# Patient Record
Sex: Female | Born: 1950 | Race: White | Hispanic: No | Marital: Married | Smoking: Never smoker
Health system: Southern US, Community
[De-identification: ages and names within clinical notes are randomized; demographics above are authoritative.]

## PROBLEM LIST (undated history)

## (undated) DIAGNOSIS — I10 Essential (primary) hypertension: Secondary | ICD-10-CM

## (undated) DIAGNOSIS — E785 Hyperlipidemia, unspecified: Secondary | ICD-10-CM

## (undated) HISTORY — PX: TUBAL LIGATION: SHX77

---

## 2009-03-20 ENCOUNTER — Encounter: Admission: RE | Admit: 2009-03-20 | Discharge: 2009-03-20 | Payer: Self-pay | Admitting: Family Medicine

## 2009-03-20 ENCOUNTER — Emergency Department (HOSPITAL_BASED_OUTPATIENT_CLINIC_OR_DEPARTMENT_OTHER): Admission: EM | Admit: 2009-03-20 | Discharge: 2009-03-20 | Payer: Self-pay | Admitting: Emergency Medicine

## 2009-03-20 ENCOUNTER — Ambulatory Visit: Payer: Self-pay | Admitting: Diagnostic Radiology

## 2009-03-29 ENCOUNTER — Emergency Department (HOSPITAL_BASED_OUTPATIENT_CLINIC_OR_DEPARTMENT_OTHER): Admission: EM | Admit: 2009-03-29 | Discharge: 2009-03-29 | Payer: Self-pay | Admitting: Emergency Medicine

## 2009-03-31 ENCOUNTER — Encounter: Admission: RE | Admit: 2009-03-31 | Discharge: 2009-03-31 | Payer: Self-pay | Admitting: Family Medicine

## 2010-07-17 ENCOUNTER — Ambulatory Visit: Payer: Self-pay | Admitting: Family Medicine

## 2010-07-17 DIAGNOSIS — M722 Plantar fascial fibromatosis: Secondary | ICD-10-CM | POA: Insufficient documentation

## 2010-07-20 ENCOUNTER — Ambulatory Visit: Payer: Self-pay | Admitting: Family Medicine

## 2010-07-29 ENCOUNTER — Telehealth (INDEPENDENT_AMBULATORY_CARE_PROVIDER_SITE_OTHER): Payer: Self-pay | Admitting: *Deleted

## 2010-08-11 IMAGING — CT CT ANGIO CHEST
2 of 6 series · 19 of 36 positions shown · IV contrast (APPLIED)
Comparison: Chest radiography same day

CLINICAL DATA: Short of breath.  Cough.  Fever.  Distant history
of contrast reaction.

CT ANGIOGRAPHY CHEST WITH CONTRAST
TECHNIQUE: Multidetector CT imaging of the chest was performed
using the standard protocol during bolus administration of
intravenous contrast. Multiplanar CT image reconstructions
including MIPs were obtained to evaluate the vascular anatomy.
Contrast: 80 ml Omnipaque 350.  The patient was treated with 125 mg
Solu-Medrol intravenously and 50 mg of Benadryl intramuscularly
hours before the scan.

[Series 6: pe 2.0 coronal · coronal · 0.50mm/px · 1 of 117 slices shown]
[im 59/117  mediastinal]
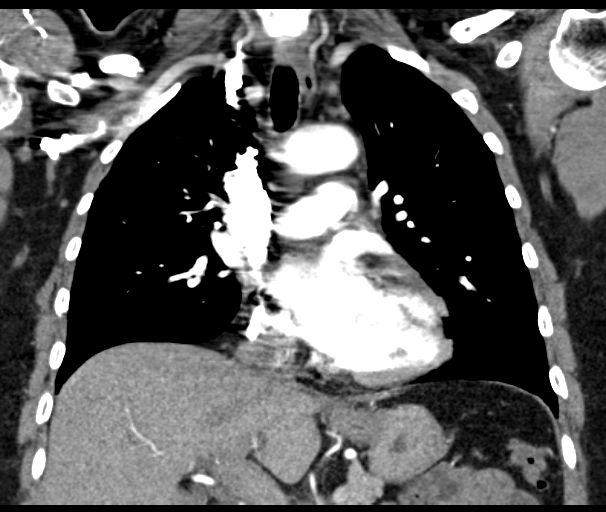

[Series 10: pe 1.0 b25f · axial · 0.64mm/px · z∈[-69,+153]mm · 18 of 248 slices shown]
[im 13/248  lung]
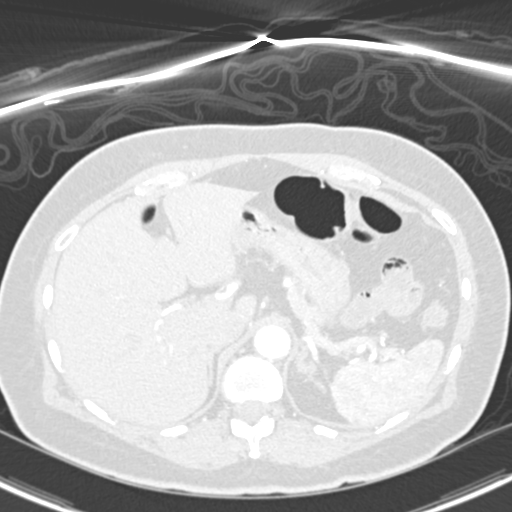
[im 25/248  mediastinal]
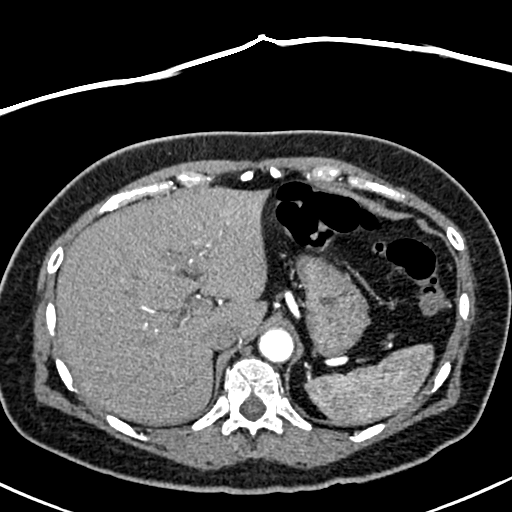
[im 38/248  lung]
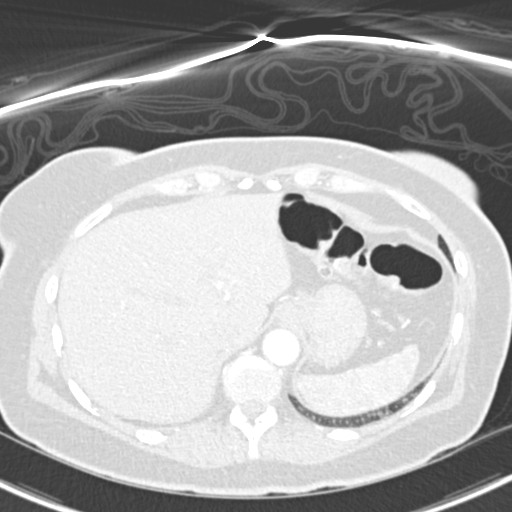
[im 50/248  mediastinal]
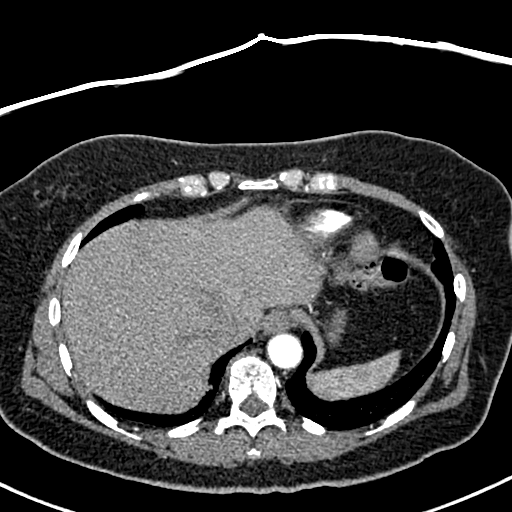
[im 62/248  lung]
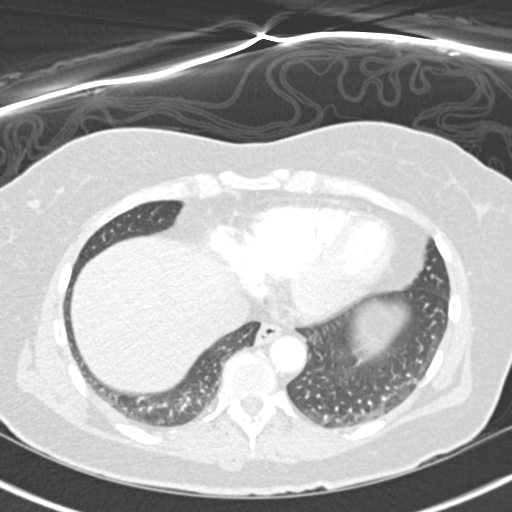
[im 75/248  mediastinal]
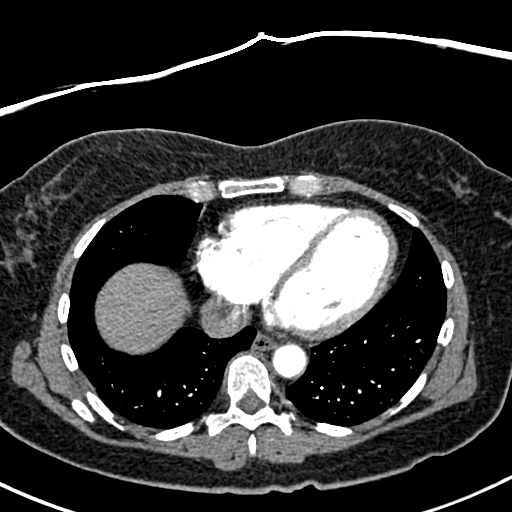
[im 87/248  lung]
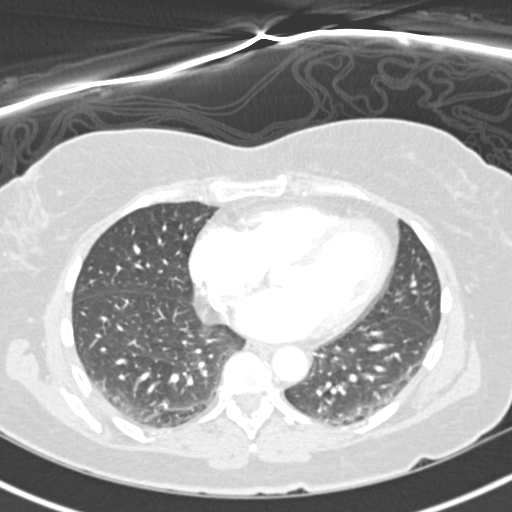
[im 99/248  mediastinal]
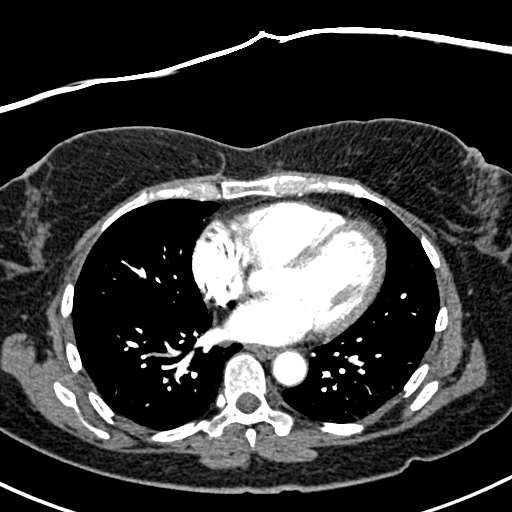
[im 112/248  lung]
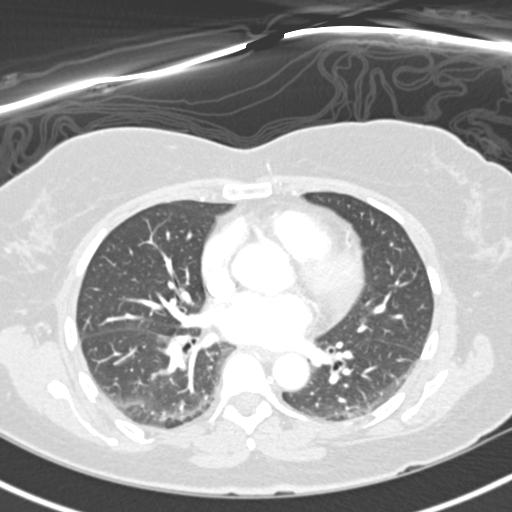
[im 136/248  mediastinal]
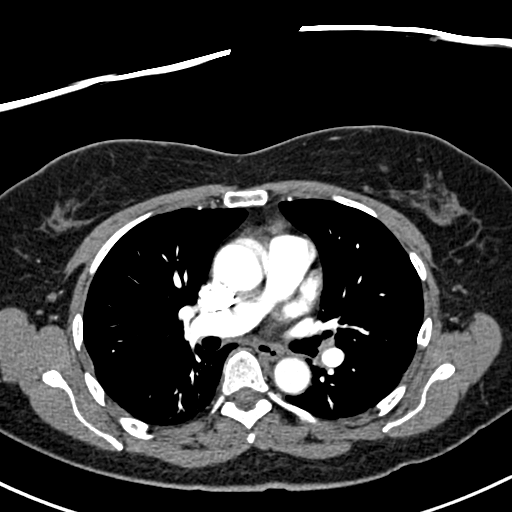
[im 149/248  lung]
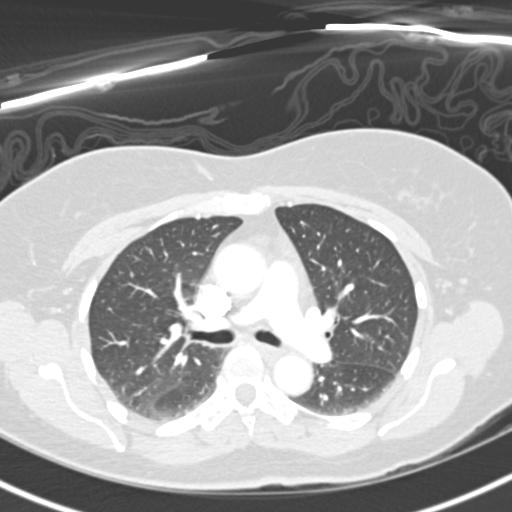
[im 161/248  mediastinal]
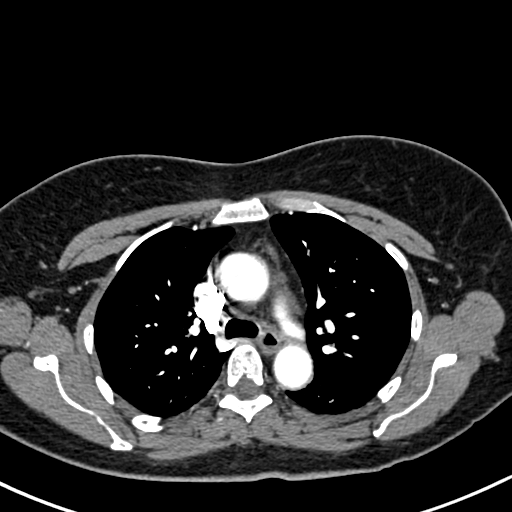
[im 173/248  lung]
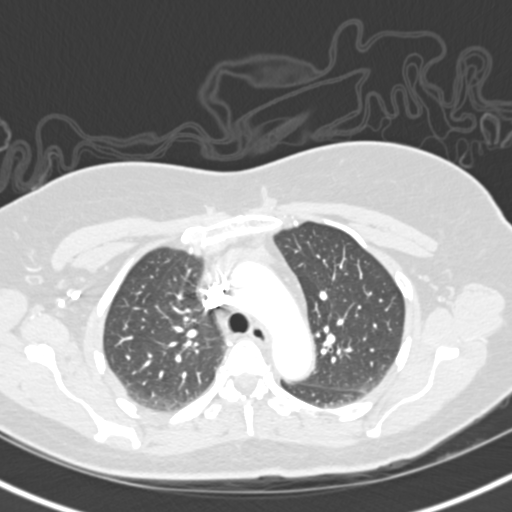
[im 186/248  mediastinal]
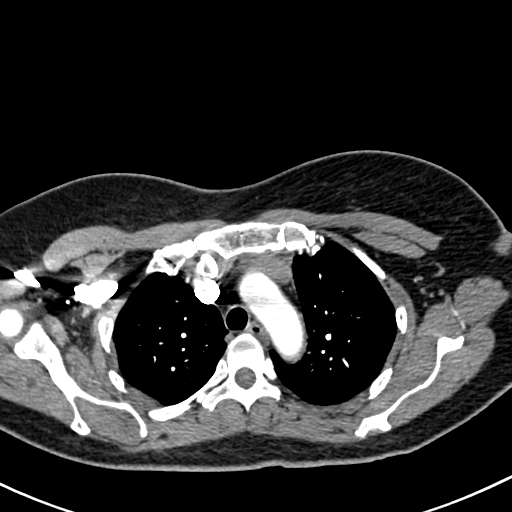
[im 198/248  lung]
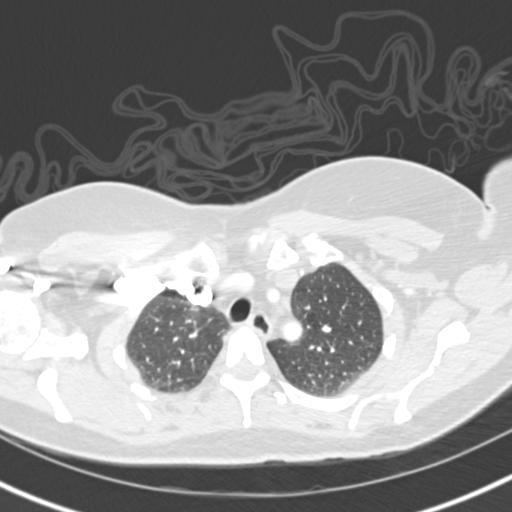
[im 210/248  mediastinal]
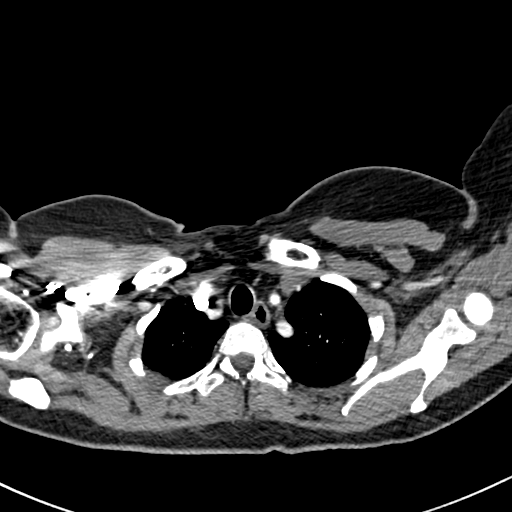
[im 223/248  lung]
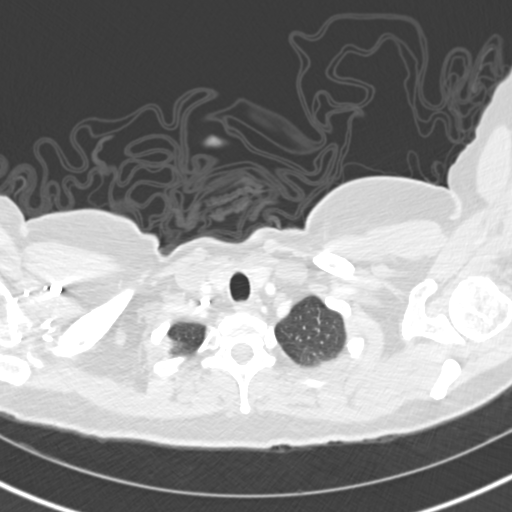
[im 235/248  mediastinal]
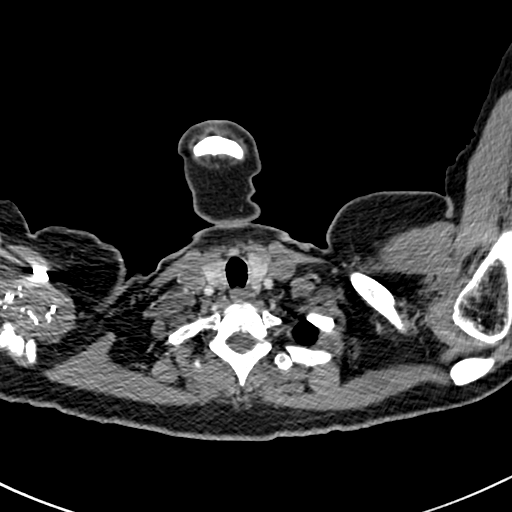

[19 of 36 positions shown; findings below may reference images not displayed]

FINDINGS: The lungs are clear.  No pleural or pericardial fluid.
The pulmonary arterial tree does not show any emboli.  Scans in the
upper abdomen are unremarkable.  No bony abnormality is seen.

Review of the MIP images confirms the above findings.
IMPRESSION: Negative CT angiography of the chest.

## 2010-09-18 ENCOUNTER — Encounter
Admission: RE | Admit: 2010-09-18 | Discharge: 2010-09-18 | Payer: Self-pay | Source: Home / Self Care | Attending: Family Medicine | Admitting: Family Medicine

## 2010-11-10 NOTE — Assessment & Plan Note (Signed)
Summary: F/U L FOOT,MC   Vital Signs:  Patient profile:   60 year old female Pulse rate:   82 / minute BP sitting:   103 / 71  (left arm) CC: f/u lt heel pain    CC:  f/u lt heel pain .  History of Present Illness: Temp orthotics have helped about 20%. still a lot of pain. wants to proceed with injection.  Physical Exam  Msk:  L foot TTP origin plantar fascia Additional Exam:  Patient given informed consent for injection. Discussed possible complications of infection, bleeding or skin atrophy at site of injection. Possible side effect of avascular necrosis (focal area of bone death) due to steroid use.Appropriate verbal time out taken Are cleaned and prepped in usual sterile fashion. A -1--- cc kennalog 40plus --3--cc 1% lidocaine without epinephrine was injected into the-area above te plantar fascia using a lateral approach--. Patient tolerated procedure well with no complications.    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 We can determine then if she is a candidate for custom orthotics

## 2010-11-10 NOTE — Progress Notes (Signed)
  Called patient left VM regarding Dr. Donnetta Hail recommendations- 2-3 miles first day to see how she does, then ok to increase to 4 miles. Rochele Pages RN  July 29, 2010 4:40 PM   ---- Converted from flag ---- ---- 07/29/2010 4:32 PM, Denny Levy MD wrote:  yes she should do 2-3 miles forst day or so to see how she is doing Thanks!  ---- 07/29/2010 3:34 PM, Rochele Pages RN wrote: Patient states she is wearing the temp insoles at work and with walking and is feeling 75% better.  States recently she went walking on 2 separate occassions walked 1 mile and she had some heel pain, and on her 3 mile walk- had no heel pain.  She wants to know if she can return to training- 4 miles per day?  ---- 07/29/2010 12:13 PM, Marily Memos wrote: Pt called she wants to know if she can get back to her usual routine to 4 miles a day, she states she is feeling about 75% better.  contact # [phone], she said leave a message ------------------------------

## 2010-11-10 NOTE — Assessment & Plan Note (Signed)
Summary: NP HEEL PAIN/MJD   Vital Signs:  Patient profile:   60 year old female Height:      61 inches Weight:      137 pounds BMI:     25.98 BP sitting:   108 / 72  Vitals Entered By: Lillia Pauls CMA (July 17, 2010 11:51 AM)  History of Present Illness: 6 m of rightheel pain. Had similar pain many years ago that self resolved. This has not improved. has gotten new shoes (rocker bottom), otc heel cups. No improvement. Standas a lot. No prior foot injury or surgery  pain is in plantar portion of heel.  Current Medications (verified): 1)  None  Review of Systems  The patient denies weight loss and weight gain.    Physical Exam  General:  alert, well-developed, well-nourished, and well-hydrated.   Msk:  Mild loss longitudinal arch B with moderate loss of transverse arch B. No foot callous. TTP origin plantar fascia.   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

## 2011-01-18 LAB — POCT I-STAT 3, ART BLOOD GAS (G3+)
Acid-base deficit: 2 mmol/L (ref 0.0–2.0)
O2 Saturation: 96 %
Patient temperature: 97.4
TCO2: 22 mmol/L (ref 0–100)

## 2011-01-18 LAB — CBC
Platelets: 221 10*3/uL (ref 150–400)
WBC: 5 10*3/uL (ref 4.0–10.5)

## 2011-01-18 LAB — COMPREHENSIVE METABOLIC PANEL
ALT: 10 U/L (ref 0–35)
Albumin: 3.8 g/dL (ref 3.5–5.2)
BUN: 21 mg/dL (ref 6–23)
Chloride: 109 mEq/L (ref 96–112)
GFR calc non Af Amer: >60 mL/min (ref >60)
Glucose, Bld: 88 mg/dL (ref 70–99)
Potassium: 3.7 mEq/L (ref 3.5–5.1)
Sodium: 142 mEq/L (ref 135–145)
Total Bilirubin: 0.1 mg/dL — ABNORMAL LOW (ref 0.3–1.2)
Total Protein: 7.1 g/dL (ref 6.0–8.3)

## 2011-01-18 LAB — POCT CARDIAC MARKERS
CKMB, poc: <1 ng/mL — ABNORMAL LOW (ref 1.0–8.0)
CKMB, poc: <1 ng/mL — ABNORMAL LOW (ref 1.0–8.0)
Myoglobin, poc: 12.6 ng/mL (ref 12–200)
Troponin i, poc: <0.05 ng/mL (ref 0.00–0.09)

## 2011-01-18 LAB — PROTIME-INR: INR: 1 (ref 0.00–1.49)

## 2011-06-02 ENCOUNTER — Encounter: Payer: Self-pay | Admitting: Family Medicine

## 2011-06-02 ENCOUNTER — Ambulatory Visit (INDEPENDENT_AMBULATORY_CARE_PROVIDER_SITE_OTHER): Payer: BC Managed Care – PPO | Admitting: Family Medicine

## 2011-06-02 DIAGNOSIS — M722 Plantar fascial fibromatosis: Secondary | ICD-10-CM

## 2011-06-02 NOTE — Progress Notes (Signed)
  Subjective:    Patient ID: Doris Valencia, female    DOB: [birth year], 60 y.o.   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 She's been dealing with a left-sided plantar fasciitis for greater than a year now. She was last seen in October at the sports medicine center, and had a corticosteroid injection into the plantar fascia. She noted an extremely good benefit, that lasted for approximately 10 months.  She notes that she left the pain gets unbearable at the last visit before seeking help, and comes here today as her pain is just starting again. In the meantime she has been doing the rehabilitation exercises occasionally, but has not been wearing the heel cups, or orthotics. She notes that the pain is worse at the calcaneus insertion of the plantar fascia on the left side, and actually gets worse throughout the day, and she does a significant amount stretches each morning. She notes no trauma, and no pain into the arch.   Review of Systems    review of systems is thoroughly reviewed and is negative with regards to the chief complaint. Objective:   Physical Exam General: Old developed, well-nourished Caucasian female in no acute distress. Skin is warm and dry. Left Ankle: No visible erythema or swelling. Range of motion is full in all directions. Strength is 5/5 in all directions. Stable lateral and medial ligaments; squeeze test and kleiger test unremarkable; Talar dome nontender; No pain at base of 5th MT; No tenderness over cuboid; No tenderness over N spot or navicular prominence No tenderness on posterior aspects of lateral and medial malleolus No sign of peroneal tendon subluxations or tenderness to palpation Negative tarsal tunnel tinel's He does have significant tenderness to palpation at the calcaneal insertion of the plantar fascia. She has a high arch bilaterally.  Gait was examined, and notes that she does have a very neutral gait with  the exception of significant overpronation.    Assessment & Plan:

## 2011-06-02 NOTE — Assessment & Plan Note (Signed)
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 Sports insoles with scaphoid pads bilaterally. Heel cups in both shoes. Oral analgesics as needed. She may come back to see [place] on an as-needed basis.

## 2011-06-02 NOTE — Progress Notes (Signed)
I have reviewed the resident's note and agree with assessment and plan as stated.

## 2011-07-09 ENCOUNTER — Ambulatory Visit: Payer: BC Managed Care – PPO | Admitting: Family Medicine

## 2011-12-23 ENCOUNTER — Other Ambulatory Visit: Payer: Self-pay | Admitting: Family Medicine

## 2011-12-23 DIAGNOSIS — Z780 Asymptomatic menopausal state: Secondary | ICD-10-CM

## 2012-01-13 ENCOUNTER — Other Ambulatory Visit: Payer: BC Managed Care – PPO

## 2012-01-13 ENCOUNTER — Ambulatory Visit
Admission: RE | Admit: 2012-01-13 | Discharge: 2012-01-13 | Disposition: A | Discharge status: Home / Self Care | Payer: BC Managed Care – PPO | Source: Ambulatory Visit | Attending: Family Medicine | Admitting: Family Medicine

## 2012-01-13 DIAGNOSIS — Z780 Asymptomatic menopausal state: Secondary | ICD-10-CM

## 2012-12-19 ENCOUNTER — Other Ambulatory Visit: Payer: Self-pay | Admitting: Family Medicine

## 2012-12-19 ENCOUNTER — Ambulatory Visit
Admission: RE | Admit: 2012-12-19 | Discharge: 2012-12-19 | Disposition: A | Discharge status: Home / Self Care | Payer: BC Managed Care – PPO | Source: Ambulatory Visit | Attending: Family Medicine | Admitting: Family Medicine

## 2012-12-19 DIAGNOSIS — M25562 Pain in left knee: Secondary | ICD-10-CM

## 2012-12-19 DIAGNOSIS — N61 Mastitis without abscess: Secondary | ICD-10-CM

## 2012-12-19 DIAGNOSIS — R52 Pain, unspecified: Secondary | ICD-10-CM

## 2012-12-19 DIAGNOSIS — N63 Unspecified lump in unspecified breast: Secondary | ICD-10-CM

## 2012-12-19 DIAGNOSIS — N6459 Other signs and symptoms in breast: Secondary | ICD-10-CM

## 2012-12-20 ENCOUNTER — Ambulatory Visit
Admission: RE | Admit: 2012-12-20 | Discharge: 2012-12-20 | Disposition: A | Discharge status: Home / Self Care | Payer: BC Managed Care – PPO | Source: Ambulatory Visit | Attending: Family Medicine | Admitting: Family Medicine

## 2012-12-20 DIAGNOSIS — N61 Mastitis without abscess: Secondary | ICD-10-CM

## 2012-12-20 DIAGNOSIS — N63 Unspecified lump in unspecified breast: Secondary | ICD-10-CM

## 2012-12-20 DIAGNOSIS — N6459 Other signs and symptoms in breast: Secondary | ICD-10-CM

## 2013-03-12 ENCOUNTER — Other Ambulatory Visit: Payer: Self-pay | Admitting: Adult Health Nurse Practitioner

## 2013-03-12 DIAGNOSIS — Z1231 Encounter for screening mammogram for malignant neoplasm of breast: Secondary | ICD-10-CM

## 2013-03-12 DIAGNOSIS — Z780 Asymptomatic menopausal state: Secondary | ICD-10-CM

## 2013-03-13 ENCOUNTER — Ambulatory Visit (INDEPENDENT_AMBULATORY_CARE_PROVIDER_SITE_OTHER): Payer: BC Managed Care – PPO

## 2013-03-13 DIAGNOSIS — Z1231 Encounter for screening mammogram for malignant neoplasm of breast: Secondary | ICD-10-CM

## 2013-03-13 DIAGNOSIS — M899 Disorder of bone, unspecified: Secondary | ICD-10-CM

## 2013-03-13 DIAGNOSIS — Z780 Asymptomatic menopausal state: Secondary | ICD-10-CM

## 2013-03-16 ENCOUNTER — Ambulatory Visit (INDEPENDENT_AMBULATORY_CARE_PROVIDER_SITE_OTHER): Payer: BC Managed Care – PPO | Admitting: Physical Therapy

## 2013-03-16 DIAGNOSIS — M25519 Pain in unspecified shoulder: Secondary | ICD-10-CM

## 2013-03-16 DIAGNOSIS — M6281 Muscle weakness (generalized): Secondary | ICD-10-CM

## 2013-03-21 ENCOUNTER — Encounter (INDEPENDENT_AMBULATORY_CARE_PROVIDER_SITE_OTHER): Payer: BC Managed Care – PPO | Admitting: Physical Therapy

## 2013-03-21 DIAGNOSIS — M25519 Pain in unspecified shoulder: Secondary | ICD-10-CM

## 2013-03-21 DIAGNOSIS — M6281 Muscle weakness (generalized): Secondary | ICD-10-CM

## 2013-03-23 ENCOUNTER — Encounter (INDEPENDENT_AMBULATORY_CARE_PROVIDER_SITE_OTHER): Payer: BC Managed Care – PPO | Admitting: Physical Therapy

## 2013-03-23 DIAGNOSIS — M25519 Pain in unspecified shoulder: Secondary | ICD-10-CM

## 2013-03-23 DIAGNOSIS — M6281 Muscle weakness (generalized): Secondary | ICD-10-CM

## 2013-03-27 ENCOUNTER — Encounter (INDEPENDENT_AMBULATORY_CARE_PROVIDER_SITE_OTHER): Payer: BC Managed Care – PPO | Admitting: Physical Therapy

## 2013-03-27 DIAGNOSIS — M25519 Pain in unspecified shoulder: Secondary | ICD-10-CM

## 2013-03-27 DIAGNOSIS — M6281 Muscle weakness (generalized): Secondary | ICD-10-CM

## 2013-03-30 ENCOUNTER — Encounter (INDEPENDENT_AMBULATORY_CARE_PROVIDER_SITE_OTHER): Payer: BC Managed Care – PPO | Admitting: Physical Therapy

## 2013-03-30 DIAGNOSIS — M6281 Muscle weakness (generalized): Secondary | ICD-10-CM

## 2013-03-30 DIAGNOSIS — M25519 Pain in unspecified shoulder: Secondary | ICD-10-CM

## 2013-04-03 ENCOUNTER — Encounter: Payer: BC Managed Care – PPO | Admitting: Physical Therapy

## 2013-04-03 DIAGNOSIS — M25519 Pain in unspecified shoulder: Secondary | ICD-10-CM

## 2013-04-03 DIAGNOSIS — M6281 Muscle weakness (generalized): Secondary | ICD-10-CM

## 2013-04-06 ENCOUNTER — Encounter: Payer: BC Managed Care – PPO | Admitting: Physical Therapy

## 2013-04-09 ENCOUNTER — Encounter: Payer: BC Managed Care – PPO | Admitting: Physical Therapy

## 2013-04-11 ENCOUNTER — Encounter: Payer: BC Managed Care – PPO | Admitting: Physical Therapy

## 2013-08-16 ENCOUNTER — Other Ambulatory Visit: Payer: Self-pay

## 2014-08-07 ENCOUNTER — Other Ambulatory Visit: Payer: Self-pay | Admitting: Adult Health Nurse Practitioner

## 2014-08-07 DIAGNOSIS — Z139 Encounter for screening, unspecified: Secondary | ICD-10-CM

## 2014-08-08 ENCOUNTER — Ambulatory Visit (INDEPENDENT_AMBULATORY_CARE_PROVIDER_SITE_OTHER): Payer: BC Managed Care – PPO

## 2014-08-08 DIAGNOSIS — Z1231 Encounter for screening mammogram for malignant neoplasm of breast: Secondary | ICD-10-CM

## 2014-08-08 DIAGNOSIS — Z139 Encounter for screening, unspecified: Secondary | ICD-10-CM

## 2017-01-12 ENCOUNTER — Other Ambulatory Visit: Payer: Self-pay | Admitting: Adult Health Nurse Practitioner

## 2017-01-12 DIAGNOSIS — Z1239 Encounter for other screening for malignant neoplasm of breast: Secondary | ICD-10-CM

## 2017-01-12 DIAGNOSIS — Z780 Asymptomatic menopausal state: Secondary | ICD-10-CM

## 2017-01-25 ENCOUNTER — Ambulatory Visit: Payer: Self-pay

## 2017-01-25 ENCOUNTER — Other Ambulatory Visit: Payer: Self-pay

## 2017-02-15 ENCOUNTER — Ambulatory Visit (INDEPENDENT_AMBULATORY_CARE_PROVIDER_SITE_OTHER): Payer: Medicare HMO

## 2017-02-15 DIAGNOSIS — Z1231 Encounter for screening mammogram for malignant neoplasm of breast: Secondary | ICD-10-CM

## 2017-02-15 DIAGNOSIS — Z1239 Encounter for other screening for malignant neoplasm of breast: Secondary | ICD-10-CM

## 2017-02-15 DIAGNOSIS — Z780 Asymptomatic menopausal state: Secondary | ICD-10-CM

## 2018-11-10 ENCOUNTER — Other Ambulatory Visit: Payer: Self-pay

## 2018-11-10 ENCOUNTER — Encounter: Payer: Self-pay | Admitting: *Deleted

## 2018-11-10 ENCOUNTER — Emergency Department
Admission: EM | Admit: 2018-11-10 | Discharge: 2018-11-10 | Disposition: A | Discharge status: Home / Self Care | Payer: Medicare HMO | Source: Home / Self Care | Attending: Emergency Medicine | Admitting: Emergency Medicine

## 2018-11-10 DIAGNOSIS — R05 Cough: Secondary | ICD-10-CM

## 2018-11-10 DIAGNOSIS — H66001 Acute suppurative otitis media without spontaneous rupture of ear drum, right ear: Secondary | ICD-10-CM

## 2018-11-10 DIAGNOSIS — J111 Influenza due to unidentified influenza virus with other respiratory manifestations: Secondary | ICD-10-CM

## 2018-11-10 DIAGNOSIS — R059 Cough, unspecified: Secondary | ICD-10-CM

## 2018-11-10 HISTORY — DX: Hyperlipidemia, unspecified: E78.5

## 2018-11-10 HISTORY — DX: Essential (primary) hypertension: I10

## 2018-11-10 MED ORDER — AMOXICILLIN 875 MG PO TABS: 875.0000 mg | ORAL_TABLET | Freq: Two times a day (BID) | ORAL | 0 refills | Status: AC

## 2018-11-10 MED ORDER — BENZONATATE 100 MG PO CAPS: 100.0000 mg | ORAL_CAPSULE | Freq: Three times a day (TID) | ORAL | 0 refills | Status: AC | PRN

## 2018-11-10 NOTE — Discharge Instructions (Addendum)
We did not do flu testing on you but do you do have symptoms of a flulike illness of 1 week duration. Take antibiotics as instructed for your otitis media. You have Tessalon Perles to take for cough.

## 2018-11-10 NOTE — ED Provider Notes (Addendum)
[facility]    CSN: [id number] Arrival date & time: 11/10/18  [id number]     History   Chief Complaint Chief Complaint  Patient presents with  . Cough    HPI Doris DixonCarol Luellen is a 68 y.o. female.  Patient returned 5 days ago from a 9-day cruise.  There [place].  She became ill 7 days ago with sore throat fever and myalgias.  This progressed to involve a cough.  [place] in [place] on Sunday but felt to poorly to visit [place].  They subsequently arrived home and she has continued to have a low-grade fever feeling lethargic and a cough which at times is productive of a small amount of phlegm.  She is currently on Coricidin BP, NyQuil, and Mucinex.  She does have some discomfort in her right ear. HPI  Past Medical History:  Diagnosis Date  . Hyperlipidemia   . Hypertension     Patient Active Problem List   Diagnosis Date Noted  . PLANTAR FASCIITIS, LEFT 07/17/2010    Past Surgical History:  Procedure Laterality Date  . TUBAL LIGATION      OB History   No obstetric history on file.      Home Medications    Prior to Admission medications   Medication Sig Start Date End Date Taking? Authorizing Provider  amoxicillin (AMOXIL) 875 MG tablet Take 1 tablet (875 mg total) by mouth 2 (two) times daily. 11/10/18   Collene Gobbleaub, Judy Pollman A, MD  atorvastatin (LIPITOR) 40 MG tablet  03/28/11   [provider]  benzonatate (TESSALON) 100 MG capsule Take 1-2 capsules (100-200 mg total) by mouth 3 (three) times daily as needed for cough. 11/10/18   Collene Gobbleaub, Julyan Gales A, MD  losartan (COZAAR) 50 MG tablet  05/15/11   [provider]    Family History History reviewed. No pertinent family history.  Social History Social History   Tobacco Use  . Smoking status: Never Smoker  . Smokeless tobacco: Never Used  Substance Use Topics  . Alcohol use: Never    Frequency: Never  . Drug use: Never      Allergies   Ace inhibitors and Iodinated diagnostic agents   Review of Systems Review of Systems  Constitutional: Positive for fatigue and fever.  HENT: Positive for congestion and sore throat.   Eyes: Negative.   Respiratory: Positive for cough. Negative for wheezing.   Cardiovascular: Negative.   Gastrointestinal: Negative.      Physical Exam Triage Vital Signs ED Triage Vitals [11/10/18 1013]  Enc Vitals Group     BP 136/84     Pulse Rate 80     Resp 16     Temp 98.2 F (36.8 C)     Temp Source Oral     SpO2 96 %     Weight 146 lb (66.2 kg)     Height 5' (1.524 m)     Head Circumference      Peak Flow      Pain Score 0     Pain Loc      Pain Edu?      Excl. in GC?    No data found.  Updated Vital Signs BP 136/84 (BP Location: Right Arm)   Pulse 80   Temp 98.2 F (36.8 C) (Oral)   Resp 16   Ht 5' (1.524 m)   Wt 66.2 kg   SpO2 96%   BMI 28.51  kg/m   Visual Acuity Right Eye Distance:   Left Eye Distance:   Bilateral Distance:    Right Eye Near:   Left Eye Near:    Bilateral Near:     Physical Exam Constitutional:      Appearance: Normal appearance.  HENT:     Head: Normocephalic.     Left Ear: Tympanic membrane normal.     Ears:     Comments: The right TM is red and bulging with purulence behind the drum.    Mouth/Throat:     Mouth: Mucous membranes are moist.     Pharynx: No posterior oropharyngeal erythema.  Eyes:     Pupils: Pupils are equal, round, and reactive to light.  Neck:     Musculoskeletal: Normal range of motion and neck supple.  Cardiovascular:     Rate and Rhythm: Normal rate and regular rhythm.  Pulmonary:     Effort: Pulmonary effort is normal.     Breath sounds: Normal breath sounds.  Neurological:     Mental Status: She is alert.      UC Treatments / Results  Labs (all labs ordered are listed, but only abnormal results are displayed) Labs Reviewed - No data to display  EKG None  Radiology No  results found.  Procedures Procedures (including critical care time)  Medications Ordered in UC Medications - No data to display  Initial Impression / Assessment and Plan / UC Course  I have reviewed the triage vital signs and the nursing notes.  Pertinent labs & imaging results that were available during my care of the patient were reviewed by me and considered in my medical decision making (see chart for details). Patient presents with flulike illness.  Husband is also ill with the same symptoms.  She has been sick a total of 7 days.  She did have a flu shot.  She currently has a secondary right otitis media.  Will treat with amoxicillin and Tessalon Perles.  She will follow-up in 48 to 72 hours if not improving.     Final Clinical Impressions(s) / UC Diagnoses   Final diagnoses:  Right acute suppurative otitis media  Flu syndrome  Cough     Discharge Instructions     We did not do flu testing on you but do you do have symptoms of a flulike illness of 1 week duration. Take antibiotics as instructed for your otitis media. You have Tessalon Perles to take for cough.    ED Prescriptions    Medication Sig Dispense Auth. Provider   amoxicillin (AMOXIL) 875 MG tablet Take 1 tablet (875 mg total) by mouth 2 (two) times daily. 20 tablet Collene Gobble, MD   benzonatate (TESSALON) 100 MG capsule Take 1-2 capsules (100-200 mg total) by mouth 3 (three) times daily as needed for cough. 40 capsule Collene Gobble, MD     Controlled Substance Prescriptions [place] Controlled Substance Registry consulted? Not Applicable   Collene Gobble, MD 11/10/18 1123    Collene Gobble, MD 11/10/18 1125

## 2018-11-10 NOTE — ED Triage Notes (Signed)
Pt c/o productive cough and fever up to 100 x 1 wk.

## 2018-11-12 ENCOUNTER — Telehealth: Payer: Self-pay | Admitting: Emergency Medicine

## 2018-11-12 NOTE — Telephone Encounter (Signed)
Patient report no fever but still feeling ill. May contact pcp tomorrow if not better.

## 2018-12-13 ENCOUNTER — Ambulatory Visit: Payer: Medicare HMO

## 2018-12-13 ENCOUNTER — Other Ambulatory Visit: Payer: Self-pay | Admitting: Adult Health Nurse Practitioner

## 2018-12-13 DIAGNOSIS — Z1231 Encounter for screening mammogram for malignant neoplasm of breast: Secondary | ICD-10-CM

## 2018-12-21 ENCOUNTER — Ambulatory Visit (INDEPENDENT_AMBULATORY_CARE_PROVIDER_SITE_OTHER): Payer: Medicare HMO

## 2018-12-21 DIAGNOSIS — Z1231 Encounter for screening mammogram for malignant neoplasm of breast: Secondary | ICD-10-CM | POA: Diagnosis not present

## 2019-03-26 ENCOUNTER — Other Ambulatory Visit: Payer: Self-pay | Admitting: Nurse Practitioner

## 2019-03-26 ENCOUNTER — Other Ambulatory Visit: Payer: Self-pay

## 2019-03-26 ENCOUNTER — Ambulatory Visit (INDEPENDENT_AMBULATORY_CARE_PROVIDER_SITE_OTHER): Payer: Medicare HMO

## 2019-03-26 DIAGNOSIS — M79671 Pain in right foot: Secondary | ICD-10-CM

## 2021-08-03 ENCOUNTER — Other Ambulatory Visit: Payer: Self-pay | Admitting: Family Medicine

## 2021-08-03 DIAGNOSIS — Z1231 Encounter for screening mammogram for malignant neoplasm of breast: Secondary | ICD-10-CM

## 2023-12-21 NOTE — Therapy (Unsigned)
 OUTPATIENT PHYSICAL THERAPY LOWER EXTREMITY EVALUATION   Patient Name: Doris Valencia MRN: [id number] DOB:09-24-1951, 73 y.o., female Today's Date: 12/22/2023  END OF SESSION:  PT End of Session - 12/22/23 1037     Visit Number 1    Number of Visits 12    Date for PT Re-Evaluation 02/02/24    Authorization Type unavailable    PT Start Time 0845    PT Stop Time 0930    PT Time Calculation (min) 45 min    Activity Tolerance Patient tolerated treatment well             Past Medical History:  Diagnosis Date   Hyperlipidemia    Hypertension    Past Surgical History:  Procedure Laterality Date   TUBAL LIGATION     Patient Active Problem List   Diagnosis Date Noted   PLANTAR FASCIITIS, LEFT 07/17/2010    PCP: Dr Dalbert Mayotte  REFERRING PROVIDER: Cecille Aver Heggerick, PA-C  REFERRING DIAG: L trochanteric bursitis; hip OA  THERAPY DIAG:  Pain in left hip  Other symptoms and signs involving the musculoskeletal system  Muscle weakness (generalized)  Rationale for Evaluation and Treatment: Rehabilitation  ONSET DATE: 09/11/23  SUBJECTIVE:   SUBJECTIVE STATEMENT: Patient reports that she started having pain in the L hip mid December with no known injury. She noticed pain with walking; lying on L side; stairs; sit to stand is the worst. Intermittent pain in hips over the years but nothing like this  PERTINENT HISTORY: Unremarkable per pt report  PAIN:  Are you having pain? Yes: NPRS scale: 4/10; can go up to 8/10 Pain location: L hip  Pain description: nagging ache Aggravating factors: walking; lying on L side; stairs; sit to stand is the worst Relieving factors: heat   PRECAUTIONS: None  RED FLAGS: None per MD note   WEIGHT BEARING RESTRICTIONS: No  FALLS:  Has patient fallen in last 6 months? 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  DIAGNOSTIC FINDINGS: xray - negative   PATIENT SURVEYS:  LEFS 14/80; 17.5%    COGNITION: Overall cognitive status: Within functional limits for tasks assessed     SENSATION: WFL  EDEMA:  none  MUSCLE LENGTH: Hamstrings: Right 65 deg; Left 60 deg Thomas test: tight bilat   POSTURE: rounded shoulders, forward head, and flexed trunk   PALPATION: Tightness L > R posterior hip through the piriformis and gluts; L lats, QL, lumbar paraspinals   LOWER EXTREMITY ROM:  Active ROM Right eval Left eval  Hip flexion    Hip extension  Tight   Hip abduction    Hip adduction    Hip internal rotation  Tight   Hip external rotation  Tight   Knee flexion Tight 105 deg  Tight 90 deg  Knee extension    Ankle dorsiflexion    Ankle plantarflexion    Ankle inversion    Ankle eversion     (Blank rows = not tested)  LOWER EXTREMITY MMT:  MMT Right eval  Left eval  Hip flexion 5- 4+  Hip extension 4+ 4+  Hip abduction 5- 4+  Hip adduction    Hip internal rotation    Hip external rotation    Knee flexion    Knee extension    Ankle dorsiflexion    Ankle plantarflexion    Ankle inversion    Ankle eversion     (Blank rows = not tested)  LOWER EXTREMITY SPECIAL TESTS:  Hip special tests: Luisa Hart (FABER) test: negative, Trendelenburg test: negative, Thomas test: tight  FUNCTIONAL TESTS:   5 times sit to stand: 9.51 sec   GAIT: Distance walked: 40 ft Assistive device utilized: None Level of assistance: Complete Independence Comments: WFL's                                                                                                                                 TREATMENT DATE:  See HEP for exercises Manual work through the posterior hip to greater trochanter including PA mobs; myofacial release; deep tissue work     PATIENT EDUCATION:  Education details: POC; HEP  Person educated: Patient Education method: Programmer, multimedia, Facilities manager, Actor cues, Verbal cues, and Handouts Education comprehension: verbalized understanding, returned demonstration, verbal cues required, tactile cues required, and needs further education  HOME EXERCISE PROGRAM: Access Code: [email or web address] URL: [email or web address] Date: 12/22/2023 Prepared by: Corlis Leak  Exercises - Supine Piriformis Stretch with Leg Straight  - 2 x daily - 7 x weekly - 1 sets - 3 reps - 30 sec  hold - Supine Lower Trunk Rotation  - 2 x daily - 7 x weekly - 1 sets - 3-5 reps - 20-30 sec  hold - Prone Quadriceps Stretch with Strap  - 2 x daily - 7 x weekly - 1 sets - 3 reps - 30 sec  hold - Standing Piriformis Release with Ball at Wall  - 2 x daily - 7 x weekly - 30-60 sec  hold  ASSESSMENT:  CLINICAL IMPRESSION: Patient is a 73 y.o. female who was seen today for physical therapy evaluation and treatment for L trochanteric bursitis; L hip OA. She has a history of L hip pain over the past 4 months with no known injury. She does sleep more on the L than R side and sits with R LE crossed over L. Patient has limited LE ROM,mobility and strength; muscular tightness to palpation through L > R posterior hip in piriformis and gluts; pain with functional activities and sleeping. Patient will benefit from PT to address problems identified.   OBJECTIVE IMPAIRMENTS: decreased mobility, decreased ROM, decreased strength, improper body mechanics, and pain.   ACTIVITY LIMITATIONS: carrying, lifting, bending, sitting, standing, squatting, stairs, transfers, bed mobility, and locomotion level  PARTICIPATION LIMITATIONS: meal  prep, cleaning, laundry, driving, shopping, and community activity  PERSONAL FACTORS: Time since onset of injury/illness/exacerbation and comorbidities: inactivity and functional postures/positions are also affecting patient's functional outcome.   REHAB POTENTIAL: Good  CLINICAL DECISION MAKING: Evolving/moderate complexity  EVALUATION COMPLEXITY: Moderate   GOALS: Goals reviewed with patient? Yes  SHORT TERM GOALS: Target date: 01/12/2024   Independent in initial HEP  Baseline: Goal status: INITIAL  2.  Patient reports ability to move from sit to stand with no pain  Baseline:  Goal status: INITIAL   LONG TERM GOALS: Target date: 02/02/2024   Pain free functional activity level  Baseline:  Goal status: INITIAL  2.  5/5 strength bilat LE's  Baseline:  Goal status: INITIAL  3.  Sleep 6-8 hours without awakening due to pain  Baseline:  Goal status: INITIAL  4.  Patient demonstrates and/or verbalizes proper body mechanics for transfers and transitional movements  Baseline:  Goal status: INITIAL  5.  Independent in HEP and return to gym  Baseline:  Goal status: INITIAL  6.  Improve LEFS by 10-20 points Baseline: LEFS 14/80; 17.5%   Goal status: INITIAL   PLAN:  PT FREQUENCY: 2x/week  PT DURATION: 8 weeks  PLANNED INTERVENTIONS: 97110-Therapeutic exercises, 97530- Therapeutic activity, 97112- Neuromuscular re-education, [phone]- Self Care, 60454- Manual therapy, [phone]- Gait training, [phone]- Aquatic Therapy, [phone]- Electrical stimulation (unattended), [phone]- Ionotophoresis 4mg /ml Dexamethasone, Balance training, Stair training, Taping, Dry Needling, and Joint mobilization  PLAN FOR NEXT SESSION: review and progress exercises; continue with education re-positioning and joint care; manual work and modalities as indicated    [employer], PT 12/22/2023, 10:38 AM

## 2023-12-22 ENCOUNTER — Other Ambulatory Visit: Payer: Self-pay

## 2023-12-22 ENCOUNTER — Encounter: Payer: Self-pay | Admitting: Rehabilitative and Restorative Service Providers"

## 2023-12-22 ENCOUNTER — Ambulatory Visit: Attending: Physician Assistant | Admitting: Rehabilitative and Restorative Service Providers"

## 2023-12-22 DIAGNOSIS — M25552 Pain in left hip: Secondary | ICD-10-CM | POA: Diagnosis not present

## 2023-12-22 DIAGNOSIS — M6281 Muscle weakness (generalized): Secondary | ICD-10-CM | POA: Insufficient documentation

## 2023-12-22 DIAGNOSIS — R29898 Other symptoms and signs involving the musculoskeletal system: Secondary | ICD-10-CM | POA: Diagnosis not present

## 2023-12-22 DIAGNOSIS — M1612 Unilateral primary osteoarthritis, left hip: Secondary | ICD-10-CM | POA: Diagnosis present

## 2023-12-22 DIAGNOSIS — M7062 Trochanteric bursitis, left hip: Secondary | ICD-10-CM | POA: Insufficient documentation

## 2023-12-28 ENCOUNTER — Ambulatory Visit: Admitting: Rehabilitative and Restorative Service Providers"

## 2023-12-28 ENCOUNTER — Encounter: Payer: Self-pay | Admitting: Rehabilitative and Restorative Service Providers"

## 2023-12-28 DIAGNOSIS — M6281 Muscle weakness (generalized): Secondary | ICD-10-CM

## 2023-12-28 DIAGNOSIS — R29898 Other symptoms and signs involving the musculoskeletal system: Secondary | ICD-10-CM

## 2023-12-28 DIAGNOSIS — M7062 Trochanteric bursitis, left hip: Secondary | ICD-10-CM | POA: Diagnosis not present

## 2023-12-28 DIAGNOSIS — M25552 Pain in left hip: Secondary | ICD-10-CM

## 2023-12-28 NOTE — Therapy (Signed)
 OUTPATIENT PHYSICAL THERAPY LOWER EXTREMITY TREATMENT   Patient Name: Doris Valencia MRN: [id number] DOB:[birth year], 73 y.o., female Today's Date: 12/28/2023  END OF SESSION:  PT End of Session - 12/28/23 0757     Visit Number 2    Number of Visits 12    Date for PT Re-Evaluation 02/02/24    Authorization Type U[employer] 16 visits approved 12/22/23 - 02/16/24 $20 copay    PT Start Time 0758    PT Stop Time 0851    PT Time Calculation (min) 53 min    Activity Tolerance Patient tolerated treatment well             Past Medical History:  Diagnosis Date   Hyperlipidemia    Hypertension    Past Surgical History:  Procedure Laterality Date   TUBAL LIGATION     Patient Active Problem List   Diagnosis Date Noted   PLANTAR FASCIITIS, LEFT 07/17/2010    PCP: Dr Dalbert Mayotte  REFERRING PROVIDER: Cecille Aver Heggerick, PA-C  REFERRING DIAG: L trochanteric bursitis; hip OA  THERAPY DIAG:  Pain in left hip  Other symptoms and signs involving the musculoskeletal system  Muscle weakness (generalized)  Rationale for Evaluation and Treatment: Rehabilitation  ONSET DATE: 09/11/23  SUBJECTIVE:   SUBJECTIVE STATEMENT: Patient reports that symptoms in the L hip are worse - possibly from the exercises. Having pain under her buttock and into the groin now. May have overdone exercises. Used the arm of her sofa for the ball release work. She did feel better with manual work and TENS unit today. Interested in trying dry needling. Left today's treatment pain free.   EVAL: Patient reports that she started having pain in the L hip mid December with no known injury. She noticed pain with walking; lying on L side; stairs; sit to stand is the worst. Intermittent pain in hips over the years but nothing like this  PERTINENT HISTORY: Unremarkable per pt report  PAIN:  Are you having pain? Yes: NPRS scale: 6/10; can go up to 8/10; 0/10 post treatment  Pain location: L hip  Pain description: nagging  ache Aggravating factors: walking; lying on L side; stairs; sit to stand is the worst Relieving factors: heat   PRECAUTIONS: None   WEIGHT BEARING RESTRICTIONS: No  FALLS:  Has patient fallen in last 6 months? 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  DIAGNOSTIC FINDINGS: xray - negative   PATIENT SURVEYS:  LEFS 14/80; 17.5%     SENSATION: WFL  EDEMA:  none  MUSCLE LENGTH: Hamstrings: Right 65 deg; Left 60 deg Thomas test: tight bilat   POSTURE: rounded shoulders, forward head, and flexed trunk   PALPATION: Tightness L > R posterior hip through the piriformis and gluts; L lats, QL, lumbar paraspinals   LOWER EXTREMITY ROM:  Active ROM Right eval Left eval  Hip flexion    Hip extension  Tight   Hip abduction    Hip adduction    Hip internal rotation  Tight   Hip external  rotation  Tight   Knee flexion Tight 105 deg  Tight 90 deg  Knee extension    Ankle dorsiflexion    Ankle plantarflexion    Ankle inversion    Ankle eversion     (Blank rows = not tested)  LOWER EXTREMITY MMT:  MMT Right eval Left eval  Hip flexion 5- 4+  Hip extension 4+ 4+  Hip abduction 5- 4+  Hip adduction    Hip internal rotation    Hip external rotation    Knee flexion    Knee extension    Ankle dorsiflexion    Ankle plantarflexion    Ankle inversion    Ankle eversion     (Blank rows = not tested)  LOWER EXTREMITY SPECIAL TESTS:  Hip special tests: Luisa Hart (FABER) test: negative,  Trendelenburg test: negative, Thomas test: tight  FUNCTIONAL TESTS:   5 times sit to stand: 9.51 sec   GAIT: Distance walked: 40 ft Assistive device utilized: None Level of assistance: Complete Independence Comments: WFL's   [facility] Adult PT Treatment:                                                DATE: 12/28/23 Therapeutic Exercise: Supine Transverse abdominals - 4 part core x 10  Gentle piriformis stretch L 20 sec x 3  Hip flexor stretch(Thomas position) 30 sec x 1 R/L   Manual Therapy: Prone lumbar spine PA mobs Grade II - lower lumbar; deep tissue work through L posterior hip through the gluts and piriformis; PA mobs L trochanter Grade II/III Therapeutic Activity:  Myofacial ball release L posterior hip standing   Diaphragmatic breathing supine count of 6  Modalities: TENS unit L posterior hip x 15 min  Moist heat lumbar to L hip x 15 min with TENS unit   Self Care: Avoid crossing legs Avoid over stretching with exercises Education re- exercise and relaxation                                                                                                                                 TREATMENT DATE:  See HEP for exercises Manual work through the posterior hip to greater trochanter including PA mobs; myofacial release; deep tissue work     PATIENT EDUCATION:  Education details: POC; HEP  Person educated: Patient Education method: Programmer, multimedia, Facilities manager, Actor cues, Verbal cues, and Handouts Education comprehension: verbalized understanding, returned demonstration, verbal cues required, tactile cues required, and needs further education  HOME EXERCISE PROGRAM: Access Code: [email or web address] URL: [email or web address] Date: 12/28/2023 Prepared by: Corlis Leak  Exercises - Supine Transversus Abdominis Bracing with Pelvic Floor Contraction  - 2 x daily - 7 x weekly - 1 sets - 10 reps - 10sec  hold - Supine Piriformis Stretch with Leg Straight  - 2 x daily  -  7 x weekly - 1 sets - 3 reps - 30 sec  hold - Hip Flexor Stretch at Edge of Bed  - 2 x daily - 7 x weekly - 1 sets - 3 reps - 30 sec  hold - Supine Diaphragmatic Breathing  - 2 x daily - 7 x weekly - 1 sets - 10 reps - 4-6 sec  hold - Standing Piriformis Release with Ball at Wall  - 2 x daily - 7 x weekly - 30-60 sec  hold  Patient Education - TENS Unit  ASSESSMENT:  CLINICAL IMPRESSION: Increase in pain in the L posterior hip and into the groin following initial treatment. Reviewed and modified exercises. Trial of myofacial release and TP work through posterior L hip with good response. Added diaphragmatic breathing and discussed relaxation techniques (meditation app) discussed trial of DN which patient is interested in trying. Trial of TENS for pain management. Patient has a trip to [place] scheduled for 4 weeks from today. Discussed possible use of TENS for any flare ups of pain with travel and walking involved with vacation.   Eval: Patient is a 73 y.o. female who was seen today for physical therapy evaluation and treatment for L trochanteric bursitis; L hip OA. She has a history of L hip pain over the past 4 months with no known injury. She does sleep more on the L than R side and sits with R LE crossed over L. Patient has limited LE ROM,mobility and strength; muscular tightness to palpation through L > R posterior hip in piriformis and gluts; pain with functional activities and sleeping. Patient will benefit from PT to address problems identified.   OBJECTIVE IMPAIRMENTS: decreased mobility, decreased ROM, decreased strength, improper body mechanics, and pain.   GOALS: Goals reviewed with patient? Yes  SHORT TERM GOALS: Target date: 01/12/2024   Independent in initial HEP  Baseline: Goal status: INITIAL  2.  Patient reports ability to move from sit to stand with no pain  Baseline:  Goal status: INITIAL   LONG TERM GOALS: Target date: 02/02/2024   Pain free functional activity  level  Baseline:  Goal status: INITIAL  2.  5/5 strength bilat LE's  Baseline:  Goal status: INITIAL  3.  Sleep 6-8 hours without awakening due to pain  Baseline:  Goal status: INITIAL  4.  Patient demonstrates and/or verbalizes proper body mechanics for transfers and transitional movements  Baseline:  Goal status: INITIAL  5.  Independent in HEP and return to gym  Baseline:  Goal status: INITIAL  6.  Improve LEFS by 10-20 points Baseline: LEFS 14/80; 17.5%   Goal status: INITIAL   PLAN:  PT FREQUENCY: 2x/week  PT DURATION: 8 weeks  PLANNED INTERVENTIONS: 97110-Therapeutic exercises, 97530- Therapeutic activity, 97112- Neuromuscular re-education, 97535- Self Care, 21308- Manual therapy, [phone]- Gait training, [phone]- Aquatic Therapy, [phone]- Electrical stimulation (unattended), [phone]- Ionotophoresis 4mg /ml Dexamethasone, Balance training, Stair training, Taping, Dry Needling, and Joint mobilization  PLAN FOR NEXT SESSION: review and progress exercises; continue with education re-positioning and joint care; manual work and modalities as indicated  Schedule trial of DN to address tightness in the L piriformis and gluts    Hairo Garraway P Sheretta Grumbine, PT 12/28/2023, 1:24 PM

## 2023-12-30 ENCOUNTER — Ambulatory Visit

## 2023-12-30 DIAGNOSIS — M6281 Muscle weakness (generalized): Secondary | ICD-10-CM

## 2023-12-30 DIAGNOSIS — M7062 Trochanteric bursitis, left hip: Secondary | ICD-10-CM | POA: Diagnosis not present

## 2023-12-30 DIAGNOSIS — M25552 Pain in left hip: Secondary | ICD-10-CM

## 2023-12-30 DIAGNOSIS — R29898 Other symptoms and signs involving the musculoskeletal system: Secondary | ICD-10-CM

## 2023-12-30 NOTE — Therapy (Signed)
 OUTPATIENT PHYSICAL THERAPY LOWER EXTREMITY TREATMENT   Patient Name: Doris Valencia MRN: [id number] DOB:1950/11/01, 73 y.o., female Today's Date: 12/30/2023  END OF SESSION:  PT End of Session - 12/30/23 0759     Visit Number 3    Number of Visits 12    Date for PT Re-Evaluation 02/02/24    Authorization Type UHC    Authorization Time Period 16 visits approved 12/22/23 - 02/16/24    Authorization - Visit Number 3    Authorization - Number of Visits 16    Progress Note Due on Visit 10    PT Start Time 0800    PT Stop Time 0845    PT Time Calculation (min) 45 min    Activity Tolerance Patient tolerated treatment well    Behavior During Therapy [facility] for tasks assessed/performed            Past Medical History:  Diagnosis Date   Hyperlipidemia    Hypertension    Past Surgical History:  Procedure Laterality Date   TUBAL LIGATION     Patient Active Problem List   Diagnosis Date Noted   PLANTAR FASCIITIS, LEFT 07/17/2010    PCP: Dr Dalbert Mayotte  REFERRING PROVIDER: Cecille Aver Heggerick, PA-C  REFERRING DIAG: L trochanteric bursitis; hip OA  THERAPY DIAG:  Pain in left hip  Other symptoms and signs involving the musculoskeletal system  Muscle weakness (generalized)  Rationale for Evaluation and Treatment: Rehabilitation  ONSET DATE: 09/11/23  SUBJECTIVE:   SUBJECTIVE STATEMENT: Patient reports her pain is back in the "hip area" today, states her pain is 3/10 today. Patient states the exercises she received last time are helping.  EVAL: Patient reports that she started having pain in the L hip mid December with no known injury. She noticed pain with walking; lying on L side; stairs; sit to stand is the worst. Intermittent pain in hips over the years but nothing like this  PERTINENT HISTORY: Unremarkable per pt report  PAIN:  Are you having pain? Yes: NPRS scale: 6/10; can go up to 8/10; 0/10 post treatment  Pain location: L hip  Pain description: nagging  ache Aggravating factors: walking; lying on L side; stairs; sit to stand is the worst Relieving factors: heat   PRECAUTIONS: None   WEIGHT BEARING RESTRICTIONS: No  FALLS:  Has patient fallen in last 6 months? 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  DIAGNOSTIC FINDINGS: xray - negative   PATIENT SURVEYS:  LEFS 14/80; 17.5%     SENSATION: WFL  EDEMA:  none  MUSCLE LENGTH: Hamstrings: Right 65 deg; Left 60 deg Thomas test: tight bilat   POSTURE: rounded shoulders, forward head, and flexed trunk   PALPATION: Tightness L > R posterior hip through the piriformis and gluts; L lats, QL, lumbar paraspinals   LOWER EXTREMITY ROM:  Active ROM Right eval Left eval  Hip flexion    Hip extension  Tight   Hip abduction    Hip adduction    Hip internal rotation  Tight  Hip external rotation  Tight   Knee flexion Tight 105 deg  Tight 90 deg  Knee extension    Ankle dorsiflexion    Ankle plantarflexion    Ankle inversion    Ankle eversion     (Blank rows = not tested)  LOWER EXTREMITY MMT:  MMT Right eval Left eval  Hip flexion 5- 4+  Hip extension 4+ 4+  Hip abduction 5- 4+  Hip adduction    Hip internal rotation    Hip external rotation    Knee flexion    Knee extension    Ankle dorsiflexion    Ankle plantarflexion    Ankle inversion    Ankle eversion     (Blank rows = not tested)  LOWER EXTREMITY SPECIAL TESTS:  Hip special tests: Luisa Hart (FABER) test: negative,  Trendelenburg test: negative, Thomas test: tight  FUNCTIONAL TESTS:   5 times sit to stand: 9.51 sec   GAIT: Distance walked: 40 ft Assistive device utilized: None Level of assistance: Complete Independence Comments: WFL's   [facility] Adult PT Treatment:                                                DATE: 12/30/2023 Therapeutic Exercise: Wide leg wipers Modified piriformis stretch Bridges --> hip add iso x5, hip abd iso x5 Self-myofascial release (L) piriformis/glute with 4" ball Neuromuscular re-ed: Hooklying TA breathing + hip add isometric (ball b/w knees) x10 Bent knee fall out + blue TB x10 (B) S/L clamshells --> YTB x5, RTB x10 S/L bent knee hip abd + RTB S/L reverse clamshell (discontinued d/t irritation) Self Care: DN info handout   [facility] Adult PT Treatment:                                                DATE: 12/28/23 Therapeutic Exercise: Supine Transverse abdominals - 4 part core x 10  Gentle piriformis stretch L 20 sec x 3  Hip flexor stretch(Thomas position) 30 sec x 1 R/L   Manual Therapy: Prone lumbar spine PA mobs Grade II - lower lumbar; deep tissue work through L posterior hip through the gluts and piriformis; PA mobs L trochanter Grade II/III Therapeutic Activity:  Myofacial ball release L posterior hip standing   Diaphragmatic breathing supine count of 6  Modalities: TENS unit L posterior hip x 15 min  Moist heat lumbar to L hip x 15 min with TENS unit   Self Care: Avoid crossing legs Avoid over stretching with exercises Education re- exercise and relaxation  TREATMENT DATE:  See HEP for exercises Manual work through the posterior hip to greater trochanter including PA mobs; myofacial release; deep tissue work     PATIENT EDUCATION:  Education details: POC; HEP  Person educated: Patient Education method: Programmer, multimedia,  Facilities manager, Actor cues, Verbal cues, and Handouts Education comprehension: verbalized understanding, returned demonstration, verbal cues required, tactile cues required, and needs further education  HOME EXERCISE PROGRAM: Access Code: [email or web address] URL: [email or web address] Date: 12/30/2023 Prepared by: Carlynn Herald  Exercises - Supine Transversus Abdominis Bracing with Pelvic Floor Contraction  - 2 x daily - 7 x weekly - 1 sets - 10 reps - 10sec  hold - Supine Piriformis Stretch with Leg Straight  - 2 x daily - 7 x weekly - 1 sets - 3 reps - 30 sec  hold - Hip Flexor Stretch at Edge of Bed  - 2 x daily - 7 x weekly - 1 sets - 3 reps - 30 sec  hold - Supine Diaphragmatic Breathing  - 2 x daily - 7 x weekly - 1 sets - 10 reps - 4-6 sec  hold - Standing Piriformis Release with Ball at [employer]  - 2 x daily - 7 x weekly - 30-60 sec  hold - Hooklying Single Leg Bent Knee Fallouts with Resistance  - 1 x daily - 7 x weekly - 3 sets - 10 reps - Clam with Resistance  - 1 x daily - 7 x weekly - 3 sets - 10 reps  Patient Education - TENS Unit  ASSESSMENT:  CLINICAL IMPRESSION: Patient demonstrated improved tolerance with hip strengthening exercises; repetitions kept low as to not exacerbate pain. HEP updated with hip abd variations. Dry needling information handout provided. Discussion with patient on pain management techniques for upcoming trip.  Eval: Patient is a 73 y.o. female who was seen today for physical therapy evaluation and treatment for L trochanteric bursitis; L hip OA. She has a history of L hip pain over the past 4 months with no known injury. She does sleep more on the L than R side and sits with R LE crossed over L. Patient has limited LE ROM,mobility and strength; muscular tightness to palpation through L > R posterior hip in piriformis and gluts; pain with functional activities and sleeping. Patient will benefit from PT to address problems identified.   OBJECTIVE  IMPAIRMENTS: decreased mobility, decreased ROM, decreased strength, improper body mechanics, and pain.   GOALS: Goals reviewed with patient? Yes  SHORT TERM GOALS: Target date: 01/12/2024   Independent in initial HEP  Baseline: Goal status: INITIAL  2.  Patient reports ability to move from sit to stand with no pain  Baseline:  Goal status: INITIAL   LONG TERM GOALS: Target date: 02/02/2024   Pain free functional activity level  Baseline:  Goal status: INITIAL  2.  5/5 strength bilat LE's  Baseline:  Goal status: INITIAL  3.  Sleep 6-8 hours without awakening due to pain  Baseline:  Goal status: INITIAL  4.  Patient demonstrates and/or verbalizes proper body mechanics for transfers and transitional movements  Baseline:  Goal status: INITIAL  5.  Independent in HEP and return to gym  Baseline:  Goal status: INITIAL  6.  Improve LEFS by 10-20 points Baseline: LEFS 14/80; 17.5%   Goal status: INITIAL   PLAN:  PT FREQUENCY: 2x/week  PT DURATION: 8 weeks  PLANNED INTERVENTIONS: 97110-Therapeutic exercises, 97530- Therapeutic activity, [id number]- Neuromuscular re-education, 97535- Self Care, 96045- Manual therapy, [id number]- Gait training, [phone]-  Aquatic Therapy, [phone]- Electrical stimulation (unattended), [phone]- Ionotophoresis 4mg /ml Dexamethasone, Balance training, Stair training, Taping, Dry Needling, and Joint mobilization  PLAN FOR NEXT SESSION: review and progress exercises; continue with education re-positioning and joint care; manual work and modalities as indicated  Interested in [employer], PTA 12/30/2023, 8:46 AM

## 2023-12-30 NOTE — Patient Instructions (Signed)

## 2024-01-02 NOTE — Therapy (Signed)
 OUTPATIENT PHYSICAL THERAPY LOWER EXTREMITY TREATMENT   Patient Name: Ardella Chhim MRN: [id number] DOB:22-Feb-1951, 73 y.o., female Today's Date: 01/03/2024  END OF SESSION:  PT End of Session - 01/03/24 0855     Visit Number 4    Number of Visits 12    Date for PT Re-Evaluation 02/02/24    Authorization Type [employer]    Authorization Time Period 16 visits approved 12/22/23 - 02/16/24    Authorization - Visit Number 4    Progress Note Due on Visit 10    PT Start Time 0854    PT Stop Time 0940   5 min heat at end   PT Time Calculation (min) 46 min    Activity Tolerance Patient tolerated treatment well    Behavior During Therapy [facility] for tasks assessed/performed             Past Medical History:  Diagnosis Date   Hyperlipidemia    Hypertension    Past Surgical History:  Procedure Laterality Date   TUBAL LIGATION     Patient Active Problem List   Diagnosis Date Noted   PLANTAR FASCIITIS, LEFT 07/17/2010    PCP: Dr Dalbert Mayotte  REFERRING PROVIDER: Cecille Aver Heggerick, PA-C  REFERRING DIAG: L trochanteric bursitis; hip OA  THERAPY DIAG:  Pain in left hip  Other symptoms and signs involving the musculoskeletal system  Muscle weakness (generalized)  Rationale for Evaluation and Treatment: Rehabilitation  ONSET DATE: 09/11/23  SUBJECTIVE:   SUBJECTIVE STATEMENT: Still hurts with standing and walking. It has improved overall.   EVAL: Patient reports that she started having pain in the L hip mid December with no known injury. She noticed pain with walking; lying on L side; stairs; sit to stand is the worst. Intermittent pain in hips over the years but nothing like this  PERTINENT HISTORY: Unremarkable per pt report  PAIN:  Are you having pain? Yes: NPRS scale: 2/10; can go up to 8/10; 0/10 post treatment  Pain location: L hip  Pain description: nagging ache Aggravating factors: walking; lying on L side; stairs; sit to stand is the worst Relieving factors: heat    PRECAUTIONS: None   WEIGHT BEARING RESTRICTIONS: No  FALLS:  Has patient fallen in last 6 months? 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  DIAGNOSTIC FINDINGS: xray - negative   PATIENT SURVEYS:  LEFS 14/80; 17.5%     SENSATION: WFL  EDEMA:  none  MUSCLE LENGTH: Hamstrings: Right 65 deg; Left 60 deg Thomas test: tight bilat   POSTURE: rounded shoulders, forward head, and flexed trunk   PALPATION: Tightness L > R posterior hip through the piriformis and gluts; L lats, QL, lumbar paraspinals   LOWER EXTREMITY ROM:  Active ROM Right eval Left eval  Hip flexion    Hip extension  Tight   Hip abduction    Hip adduction    Hip internal rotation  Tight   Hip external rotation  Tight   Knee flexion Tight 105 deg  Tight 90 deg  Knee extension    Ankle dorsiflexion    Ankle plantarflexion    Ankle inversion    Ankle eversion     (Blank rows = not tested)  LOWER EXTREMITY MMT:  MMT Right eval Left eval  Hip flexion 5- 4+  Hip extension 4+ 4+  Hip abduction 5- 4+  Hip adduction    Hip internal rotation    Hip external rotation    Knee flexion    Knee extension    Ankle dorsiflexion    Ankle plantarflexion    Ankle inversion    Ankle eversion     (Blank rows = not tested)  LOWER EXTREMITY SPECIAL TESTS:  Hip special tests: Luisa Hart (FABER) test: negative, Trendelenburg test: negative, Thomas test: tight  FUNCTIONAL TESTS:   5 times sit to stand: 9.51 sec    GAIT: Distance walked: 40 ft Assistive device utilized: None Level of assistance: Complete Independence Comments: WFL's   [facility] Adult PT Treatment:                                                DATE: 01/03/2024 Neuromuscular re-ed: Hooklying TA breathing + hip add isometric (ball b/w knees) x10 Bent knee fall out + blue TB 2x10 (B) Bridge with BKFO + blue TB 2x10 S/L clamshells --> BTB 3 sec hold with eccentric lowering 2 x 10 Seated  B hip IR x 20 Self Care: DN education and aftercare including HEP - stretches only today  Manual: Trigger Point Dry Needling  Initial Treatment: Pt instructed on Dry Needling rational, procedures, and possible side effects. Pt instructed to expect mild to moderate muscle soreness later in the day and/or into the next day.  Pt instructed in methods to reduce muscle soreness. Pt instructed to continue prescribed HEP. Patient was educated on signs and symptoms of infection and other risk factors and advised to seek medical attention should they occur.  Patient verbalized understanding of these instructions and education.   Patient Verbal Consent Given: Yes Education Handout Provided: Previously Provided Muscles Treated: L gluteals, piriformis and proximal lateral quad Electrical Stimulation Performed: No Treatment Response/Outcome: Utilized skilled palpation to identify bony landmarks and trigger points.  Able to illicit twitch response and muscle elongation.  Soft tissue mobilization to L gluteals, piriformis and lateral quads following DN to further promote tissue elongation and decreased pain.     MHP to L hip and proximal thigh x 5 min at end of session     [facility] Adult PT Treatment:                                                DATE: 12/30/2023 Therapeutic Exercise: Wide leg wipers Modified piriformis stretch Bridges --> hip add iso x5, hip abd iso x5 Self-myofascial release (L) piriformis/glute with 4" ball Neuromuscular  re-ed: Hooklying TA breathing + hip add isometric (ball b/w knees) x10 Bent knee fall out + blue TB x10 (B) S/L clamshells --> YTB x5, RTB x10 S/L bent knee hip abd + RTB S/L reverse clamshell (discontinued d/t irritation) Self Care: DN info handout   [facility] Adult PT Treatment:  DATE: 12/28/23 Therapeutic Exercise: Supine Transverse abdominals - 4 part core x 10  Gentle piriformis stretch L 20 sec x 3  Hip flexor stretch(Thomas position) 30 sec x 1 R/L   Manual Therapy: Prone lumbar spine PA mobs Grade II - lower lumbar; deep tissue work through L posterior hip through the gluts and piriformis; PA mobs L trochanter Grade II/III Therapeutic Activity:  Myofacial ball release L posterior hip standing   Diaphragmatic breathing supine count of 6  Modalities: TENS unit L posterior hip x 15 min  Moist heat lumbar to L hip x 15 min with TENS unit   Self Care: Avoid crossing legs Avoid over stretching with exercises Education re- exercise and relaxation                                                                                                                                 TREATMENT DATE:  See HEP for exercises Manual work through the posterior hip to greater trochanter including PA mobs; myofacial release; deep tissue work     PATIENT EDUCATION:  Education details: POC; HEP  Person educated: Patient Education method: Programmer, multimedia, Facilities manager, Actor cues, Verbal cues, and Handouts Education comprehension: verbalized understanding, returned demonstration, verbal cues required, tactile cues required, and needs further education  HOME EXERCISE PROGRAM: Access Code: [email or web address] URL: [email or web address] Date: 12/30/2023 Prepared by: Carlynn Herald  Exercises - Supine Transversus Abdominis Bracing with Pelvic Floor Contraction  - 2 x daily - 7 x weekly - 1 sets - 10 reps - 10sec  hold - Supine Piriformis Stretch  with Leg Straight  - 2 x daily - 7 x weekly - 1 sets - 3 reps - 30 sec  hold - Hip Flexor Stretch at Edge of Bed  - 2 x daily - 7 x weekly - 1 sets - 3 reps - 30 sec  hold - Supine Diaphragmatic Breathing  - 2 x daily - 7 x weekly - 1 sets - 10 reps - 4-6 sec  hold - Standing Piriformis Release with Ball at [employer]  - 2 x daily - 7 x weekly - 30-60 sec  hold - Hooklying Single Leg Bent Knee Fallouts with Resistance  - 1 x daily - 7 x weekly - 3 sets - 10 reps - Clam with Resistance  - 1 x daily - 7 x weekly - 3 sets - 10 reps  Patient Education - TENS Unit  ASSESSMENT:  CLINICAL IMPRESSION: Excellent response to DN in gluteals, piriformis and lateral quads with elongation of tissues. Patient reports mild soreness at end of session but no greater than 1/10. She reports pain with hip IR activities and ongoing pain with standing and walking.   Eval: Patient is a 73 y.o. female who was seen today for physical therapy evaluation and treatment for L trochanteric bursitis; L hip OA. She has a history of L hip pain over the past 4 months with no known injury.  She does sleep more on the L than R side and sits with R LE crossed over L. Patient has limited LE ROM,mobility and strength; muscular tightness to palpation through L > R posterior hip in piriformis and gluts; pain with functional activities and sleeping. Patient will benefit from PT to address problems identified.   OBJECTIVE IMPAIRMENTS: decreased mobility, decreased ROM, decreased strength, improper body mechanics, and pain.   GOALS: Goals reviewed with patient? Yes  SHORT TERM GOALS: Target date: 01/12/2024   Independent in initial HEP  Baseline: Goal status: INITIAL  2.  Patient reports ability to move from sit to stand with no pain  Baseline:  Goal status: INITIAL   LONG TERM GOALS: Target date: 02/02/2024   Pain free functional activity level  Baseline:  Goal status: INITIAL  2.  5/5 strength bilat LE's  Baseline:  Goal  status: INITIAL  3.  Sleep 6-8 hours without awakening due to pain  Baseline:  Goal status: INITIAL  4.  Patient demonstrates and/or verbalizes proper body mechanics for transfers and transitional movements  Baseline:  Goal status: INITIAL  5.  Independent in HEP and return to gym  Baseline:  Goal status: INITIAL  6.  Improve LEFS by 10-20 points Baseline: LEFS 14/80; 17.5%   Goal status: INITIAL   PLAN:  PT FREQUENCY: 2x/week  PT DURATION: 8 weeks  PLANNED INTERVENTIONS: 97110-Therapeutic exercises, 97530- Therapeutic activity, 97112- Neuromuscular re-education, [phone]- Self Care, 60454- Manual therapy, [phone]- Gait training, [phone]- Aquatic Therapy, [phone]- Electrical stimulation (unattended), [phone]- Ionotophoresis 4mg /ml Dexamethasone, Balance training, Stair training, Taping, Dry Needling, and Joint mobilization  PLAN FOR NEXT SESSION: Assess response to DN  and continue as indicated possibly with estim, review and progress exercises; continue with education re-positioning and joint care; manual work and modalities as indicated      [employer], PT  01/03/2024, 9:46 AM

## 2024-01-03 ENCOUNTER — Ambulatory Visit: Admitting: Physical Therapy

## 2024-01-03 ENCOUNTER — Encounter: Payer: Self-pay | Admitting: Physical Therapy

## 2024-01-03 DIAGNOSIS — R29898 Other symptoms and signs involving the musculoskeletal system: Secondary | ICD-10-CM

## 2024-01-03 DIAGNOSIS — M6281 Muscle weakness (generalized): Secondary | ICD-10-CM

## 2024-01-03 DIAGNOSIS — M7062 Trochanteric bursitis, left hip: Secondary | ICD-10-CM | POA: Diagnosis not present

## 2024-01-03 DIAGNOSIS — M25552 Pain in left hip: Secondary | ICD-10-CM

## 2024-01-04 NOTE — Therapy (Addendum)
 OUTPATIENT PHYSICAL THERAPY LOWER EXTREMITY TREATMENT PHYSICAL THERAPY DISCHARGE SUMMARY  Visits from Start of Care: 5  Current functional level related to goals / functional outcomes: See progress note for discharge status    Remaining deficits: Unknown    Education / Equipment: HEP    Patient agrees to discharge. Patient goals were not met. Patient is being discharged due to not returning since the last visit.  Doris Valencia PT, MPH 07/04/24 2:30 PM For Doris Valencia, PT     Patient Name: Doris Valencia MRN: [id number] DOB:10-28-1950, 73 y.o., female Today's Date: 01/05/2024  END OF SESSION:  PT End of Session - 01/05/24 0805     Visit Number 5    Number of Visits 12    Date for PT Re-Evaluation 02/02/24    Authorization Type U[employer]    Authorization Time Period 16 visits approved 12/22/23 - 02/16/24    Authorization - Visit Number 5    Authorization - Number of Visits 16    Progress Note Due on Visit 10    PT Start Time 0805    PT Stop Time 0847    PT Time Calculation (min) 42 min    Activity Tolerance Patient tolerated treatment well    Behavior During Therapy [facility] for tasks assessed/performed              Past Medical History:  Diagnosis Date   Hyperlipidemia    Hypertension    Past Surgical History:  Procedure Laterality Date   TUBAL LIGATION     Patient Active Problem List   Diagnosis Date Noted   PLANTAR FASCIITIS, LEFT 07/17/2010    PCP: Dr Donald Bologna  REFERRING PROVIDER: selinda SAUNDERS Heggerick, PA-C  REFERRING DIAG: L trochanteric bursitis; hip OA  THERAPY DIAG:  Pain in left hip  Other symptoms and signs involving the musculoskeletal system  Muscle weakness (generalized)  Rationale for Evaluation and Treatment: Rehabilitation  ONSET DATE: 09/11/23  SUBJECTIVE:   SUBJECTIVE STATEMENT: Patient reports increased pain to 5/10 since last session. She also reports that occasionally she gets referred pain down her left leg when in bed.  These symptoms occurred before last session. She is planning to get steroid injection on Tuesday.  EVAL: Patient reports that she started having pain in the L hip mid December with no known injury. She noticed pain with walking; lying on L side; stairs; sit to stand is the worst. Intermittent pain in hips over the years but nothing like this  PERTINENT HISTORY: Unremarkable per pt report  PAIN:  Are you having pain? Yes: NPRS scale: 5/10; can go up to 8/10; 0/10 post treatment  Pain location: L hip  Pain description: nagging ache Aggravating factors: walking; lying on L side; stairs; sit to stand is the worst Relieving factors: heat   PRECAUTIONS: None   WEIGHT BEARING RESTRICTIONS: No  FALLS:  Has patient fallen in last 6 months? 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  DIAGNOSTIC FINDINGS: xray - negative   PATIENT SURVEYS:  LEFS 14/80; 17.5%     SENSATION: WFL  EDEMA:  none  MUSCLE LENGTH: Hamstrings: Right 65 deg; Left 60 deg Thomas test: tight bilat   POSTURE: rounded shoulders, forward head, and flexed trunk   PALPATION: Tightness L > R posterior hip through the piriformis and gluts; L lats, QL, lumbar paraspinals   LOWER EXTREMITY ROM:  Active ROM Right eval Left eval  Hip flexion    Hip extension  Tight   Hip abduction    Hip adduction    Hip internal rotation  Tight   Hip external rotation  Tight   Knee flexion Tight 105 deg  Tight 90 deg  Knee extension    Ankle  dorsiflexion    Ankle plantarflexion    Ankle inversion    Ankle eversion     (Blank rows = not tested)  LOWER EXTREMITY MMT:  MMT Right eval Left eval  Hip flexion 5- 4+  Hip extension 4+ 4+  Hip abduction 5- 4+  Hip adduction    Hip internal rotation    Hip external rotation    Knee flexion    Knee extension    Ankle dorsiflexion    Ankle plantarflexion    Ankle inversion    Ankle eversion     (Blank rows = not tested)  LOWER EXTREMITY SPECIAL TESTS:  Hip special tests: Belvie (FABER) test: negative, Trendelenburg test: negative, Thomas test: tight  FUNCTIONAL TESTS:   5 times sit to stand: 9.51 sec   GAIT: Distance walked: 40 ft Assistive device utilized: None Level of assistance: Complete Independence Comments: WFL's  01/05/24: LUMBAR ROM: WNL except pain with left SB and decreased range by 75%.  [facility] Adult PT Treatment:                                                DATE: 01/05/2024 Therapeutic Exercise: Nustep L 4 x 7 min Prone press ups x 10 Standing lumbar ext x 10 Lumbar ext at wall x 10 Lateral glide stretch L side x 20 sec - no significant stretch but felt in middle of low back Modified piriformis stretch seated to opp shoulder Intermittent walking around track to assess symptoms post treatment  Manual: Long leg distraction 2 x 30 sec L leg - no pain upon standing Lateral distraction with strap - pulses and prolonged holds UPA mobs B: pain at L L4/5 tingling into leg, pain at L3 and less at L 2    [facility] Adult PT Treatment:                                                DATE: 01/03/2024 Neuromuscular re-ed: Hooklying TA breathing + hip add isometric (ball b/w knees) x10 Bent knee fall out + blue TB 2x10 (B) Bridge with BKFO + blue TB 2x10 S/L clamshells --> BTB 3 sec hold with eccentric lowering 2 x 10 Seated  B hip IR x 20 Self Care: DN education and aftercare including HEP - stretches only today  Manual: Trigger Point Dry Needling  Initial  Treatment: Pt instructed on Dry Needling rational, procedures, and possible side effects. Pt instructed to expect mild to moderate muscle soreness later in the day and/or into the next  day.  Pt instructed in methods to reduce muscle soreness. Pt instructed to continue prescribed HEP. Patient was educated on signs and symptoms of infection and other risk factors and advised to seek medical attention should they occur.  Patient verbalized understanding of these instructions and education.   Patient Verbal Consent Given: Yes Education Handout Provided: Previously Provided Muscles Treated: L gluteals, piriformis and proximal lateral quad Electrical Stimulation Performed: No Treatment Response/Outcome: Utilized skilled palpation to identify bony landmarks and trigger points.  Able to illicit twitch response and muscle elongation.  Soft tissue mobilization to L gluteals, piriformis and lateral quads following DN to further promote tissue elongation and decreased pain.     MHP to L hip and proximal thigh x 5 min at end of session     [facility] Adult PT Treatment:                                                DATE: 12/30/2023 Therapeutic Exercise: Wide leg wipers Modified piriformis stretch Bridges --> hip add iso x5, hip abd iso x5 Self-myofascial release (L) piriformis/glute with 4 ball Neuromuscular re-ed: Hooklying TA breathing + hip add isometric (ball b/w knees) x10 Bent knee fall out + blue TB x10 (B) S/L clamshells --> YTB x5, RTB x10 S/L bent knee hip abd + RTB S/L reverse clamshell (discontinued d/t irritation) Self Care: DN info handout     PATIENT EDUCATION:  Education details: HEP Update, explanation of goal for lumbar extensions; advice to use massage gun to gluteals Person educated: Patient Education method: Explanation, Demonstration, Tactile cues, Verbal cues, and Handouts Education comprehension: verbalized understanding, returned demonstration, verbal cues required,  tactile cues required, and needs further education  HOME EXERCISE PROGRAM: Access Code: [email or web address] URL: [email or web address] Date: 12/30/2023 Prepared by: Lamarr Price  Access Code: [email or web address] URL: [email or web address] Date: 01/05/2024 Prepared by: Doris  Exercises - Supine Transversus Abdominis Bracing with Pelvic Floor Contraction  - 2 x daily - 7 x weekly - 1 sets - 10 reps - 10sec  hold - Supine Piriformis Stretch with Leg Straight  - 2 x daily - 7 x weekly - 1 sets - 3 reps - 30 sec  hold - Hip Flexor Stretch at Edge of Bed  - 2 x daily - 7 x weekly - 1 sets - 3 reps - 30 sec  hold - Supine Diaphragmatic Breathing  - 2 x daily - 7 x weekly - 1 sets - 10 reps - 4-6 sec  hold - Standing Piriformis Release with Ball at [employer]  - 2 x daily - 7 x weekly - 30-60 sec  hold - Hooklying Single Leg Bent Knee Fallouts with Resistance  - 1 x daily - 7 x weekly - 3 sets - 10 reps - Clam with Resistance  - 1 x daily - 7 x weekly - 3 sets - 10 reps - Prone Press Up  - 3-4 x daily - 7 x weekly - 1-3 sets - 10 reps - Standing Lumbar Extension at Wall - Forearms  - 4-5 x daily - 7 x weekly - 2 sets - 10 reps - Standing Lumbar Extension  - 1 x daily - 3 x weekly - 2 sets - 10 reps  Patient Education - TENS Unit  ASSESSMENT:  CLINICAL IMPRESSION: Gionni presents today with increased pain since last visit. Hip distraction provided relief  upon standing to no pain, but when pt began walking again pain returned in L lateral hip to 2/10. Lumbar ROM assessment shows pain and limited range in left SB. Otherwise WNL. She had increased tingling into her L LE with UPA mobs to L4/5 and reported pain at L2/3 and L3/4 as well. Lumbar extension exercises abolish all pain, but walking causes return of symptoms to hip/groin to 2-3/10. Lumbar extension exercises added to HEP and pt advised to do these regularly to see if sx improve. Advised her that low back may hurt more, but that is okay as  long as hip and LE symptoms go away. If injection helps, pt will call and place PT on hold. She is anxious to return to walking for her upcoming trip [place].   Eval: Patient is a 73 y.o. female who was seen today for physical therapy evaluation and treatment for L trochanteric bursitis; L hip OA. She has a history of L hip pain over the past 4 months with no known injury. She does sleep more on the L than R side and sits with R LE crossed over L. Patient has limited LE ROM,mobility and strength; muscular tightness to palpation through L > R posterior hip in piriformis and gluts; pain with functional activities and sleeping. Patient will benefit from PT to address problems identified.   OBJECTIVE IMPAIRMENTS: decreased mobility, decreased ROM, decreased strength, improper body mechanics, and pain.   GOALS: Goals reviewed with patient? Yes  SHORT TERM GOALS: Target date: 01/12/2024   Independent in initial HEP  Baseline: Goal status: INITIAL  2.  Patient reports ability to move from sit to stand with no pain  Baseline:  Goal status: INITIAL   LONG TERM GOALS: Target date: 02/02/2024   Pain free functional activity level  Baseline:  Goal status: INITIAL  2.  5/5 strength bilat LE's  Baseline:  Goal status: INITIAL  3.  Sleep 6-8 hours without awakening due to pain  Baseline:  Goal status: INITIAL  4.  Patient demonstrates and/or verbalizes proper body mechanics for transfers and transitional movements  Baseline:  Goal status: INITIAL  5.  Independent in HEP and return to gym  Baseline:  Goal status: INITIAL  6.  Improve LEFS by 10-20 points Baseline: LEFS 14/80; 17.5%   Goal status: INITIAL   PLAN:  PT FREQUENCY: 2x/week  PT DURATION: 8 weeks  PLANNED INTERVENTIONS: 97110-Therapeutic exercises, 97530- Therapeutic activity, 97112- Neuromuscular re-education, 97535- Self Care, [id number]- Manual therapy, [phone]- Gait training, [phone]- Aquatic Therapy, [phone]- Electrical  stimulation (unattended), [phone]- Ionotophoresis 4mg /ml Dexamethasone, Balance training, Stair training, Taping, Dry Needling, and Joint mobilization  PLAN FOR NEXT SESSION: Assess response to prone press ups/lumbar extension. Did injection help? Trial of ionto?  continue with education re-positioning and joint care; manual work and modalities as indicated      Doris Valencia, PT  01/05/2024, 12:54 PM

## 2024-01-05 ENCOUNTER — Encounter: Payer: Self-pay | Admitting: Physical Therapy

## 2024-01-05 ENCOUNTER — Ambulatory Visit: Admitting: Physical Therapy

## 2024-01-05 DIAGNOSIS — M25552 Pain in left hip: Secondary | ICD-10-CM

## 2024-01-05 DIAGNOSIS — M6281 Muscle weakness (generalized): Secondary | ICD-10-CM

## 2024-01-05 DIAGNOSIS — R29898 Other symptoms and signs involving the musculoskeletal system: Secondary | ICD-10-CM

## 2024-01-05 DIAGNOSIS — M7062 Trochanteric bursitis, left hip: Secondary | ICD-10-CM | POA: Diagnosis not present

## 2024-01-10 ENCOUNTER — Ambulatory Visit: Admitting: Rehabilitative and Restorative Service Providers"

## 2024-01-12 ENCOUNTER — Ambulatory Visit: Admitting: Physical Therapy
